# Patient Record
Sex: Female | Born: 1990 | Race: Black or African American | Hispanic: No | Marital: Single | Smoking: Never smoker
Health system: Southern US, Community
[De-identification: ages and names within clinical notes are randomized; demographics above are authoritative.]

---

## 2009-06-14 ENCOUNTER — Emergency Department (HOSPITAL_COMMUNITY): Admission: EM | Admit: 2009-06-14 | Discharge: 2009-06-14 | Payer: Self-pay | Admitting: Emergency Medicine

## 2010-10-19 ENCOUNTER — Emergency Department (HOSPITAL_COMMUNITY)
Admission: EM | Admit: 2010-10-19 | Discharge: 2010-10-19 | Payer: Self-pay | Source: Home / Self Care | Admitting: Emergency Medicine

## 2010-10-21 ENCOUNTER — Emergency Department (HOSPITAL_COMMUNITY)
Admission: EM | Admit: 2010-10-21 | Discharge: 2010-10-21 | Payer: Self-pay | Source: Home / Self Care | Admitting: Emergency Medicine

## 2011-03-04 ENCOUNTER — Inpatient Hospital Stay (INDEPENDENT_AMBULATORY_CARE_PROVIDER_SITE_OTHER)
Admission: RE | Admit: 2011-03-04 | Discharge: 2011-03-04 | Disposition: A | Discharge status: Home / Self Care | Payer: Self-pay | Source: Ambulatory Visit | Attending: Family Medicine | Admitting: Family Medicine

## 2011-03-04 DIAGNOSIS — S335XXA Sprain of ligaments of lumbar spine, initial encounter: Secondary | ICD-10-CM

## 2011-07-06 ENCOUNTER — Emergency Department (HOSPITAL_COMMUNITY)
Admission: EM | Admit: 2011-07-06 | Discharge: 2011-07-06 | Disposition: A | Discharge status: Home / Self Care | Payer: Self-pay | Attending: Emergency Medicine | Admitting: Emergency Medicine

## 2011-07-06 ENCOUNTER — Emergency Department (HOSPITAL_COMMUNITY): Payer: Self-pay

## 2011-07-06 DIAGNOSIS — S51809A Unspecified open wound of unspecified forearm, initial encounter: Secondary | ICD-10-CM | POA: Insufficient documentation

## 2011-07-06 DIAGNOSIS — S61209A Unspecified open wound of unspecified finger without damage to nail, initial encounter: Secondary | ICD-10-CM | POA: Insufficient documentation

## 2011-07-06 DIAGNOSIS — S81009A Unspecified open wound, unspecified knee, initial encounter: Secondary | ICD-10-CM | POA: Insufficient documentation

## 2011-07-19 ENCOUNTER — Emergency Department (HOSPITAL_COMMUNITY)
Admission: EM | Admit: 2011-07-19 | Discharge: 2011-07-19 | Disposition: A | Discharge status: Home / Self Care | Payer: Self-pay | Attending: Emergency Medicine | Admitting: Emergency Medicine

## 2011-07-19 DIAGNOSIS — Z4802 Encounter for removal of sutures: Secondary | ICD-10-CM | POA: Insufficient documentation

## 2011-08-15 ENCOUNTER — Emergency Department (HOSPITAL_COMMUNITY)
Admission: EM | Admit: 2011-08-15 | Discharge: 2011-08-15 | Disposition: A | Discharge status: Home / Self Care | Payer: Self-pay | Source: Home / Self Care

## 2011-08-15 ENCOUNTER — Encounter: Payer: Self-pay | Admitting: *Deleted

## 2011-08-15 DIAGNOSIS — N760 Acute vaginitis: Secondary | ICD-10-CM

## 2011-08-15 DIAGNOSIS — M545 Low back pain: Secondary | ICD-10-CM

## 2011-08-15 LAB — POCT URINALYSIS DIP (DEVICE)
Bilirubin Urine: NEGATIVE
Ketones, ur: NEGATIVE mg/dL
Nitrite: NEGATIVE
Protein, ur: NEGATIVE mg/dL
Specific Gravity, Urine: >=1.03 (ref 1.005–1.030)
pH: 6 (ref 5.0–8.0)
pH: 6 (ref 5.0–8.0)

## 2011-08-15 LAB — WET PREP, GENITAL

## 2011-08-15 MED ORDER — METHOCARBAMOL 750 MG PO TABS: 750.0000 mg | ORAL_TABLET | Freq: Four times a day (QID) | ORAL | Status: AC

## 2011-08-15 MED ORDER — IBUPROFEN 800 MG PO TABS: 800.0000 mg | ORAL_TABLET | Freq: Three times a day (TID) | ORAL | Status: AC

## 2011-08-15 MED ORDER — METRONIDAZOLE 500 MG PO TABS: 500.0000 mg | ORAL_TABLET | Freq: Two times a day (BID) | ORAL | Status: AC
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
## 2011-08-16 LAB — GC/CHLAMYDIA PROBE AMP, GENITAL
Chlamydia, DNA Probe: NEGATIVE
GC Probe Amp, Genital: NEGATIVE

## 2011-08-18 NOTE — ED Provider Notes (Signed)
Medical screening examination/treatment/procedure(s) were performed by non-physician practitioner and as supervising physician I was immediately available for consultation/collaboration.  Luiz Blare MD   Luiz Blare, MD 08/18/11 1007

## 2011-08-18 NOTE — ED Notes (Signed)
Labs and medications reviewed. Pt. adequately treated for bacterial vaginosis  with Flagyl. Vassie Moselle 08/18/2011

## 2012-11-16 ENCOUNTER — Emergency Department (HOSPITAL_COMMUNITY): Payer: Self-pay

## 2012-11-16 ENCOUNTER — Encounter (HOSPITAL_COMMUNITY): Payer: Self-pay | Admitting: Emergency Medicine

## 2012-11-16 ENCOUNTER — Emergency Department (HOSPITAL_COMMUNITY)
Admission: EM | Admit: 2012-11-16 | Discharge: 2012-11-16 | Disposition: A | Discharge status: Home / Self Care | Payer: No Typology Code available for payment source | Attending: Emergency Medicine | Admitting: Emergency Medicine

## 2012-11-16 DIAGNOSIS — S298XXA Other specified injuries of thorax, initial encounter: Secondary | ICD-10-CM | POA: Insufficient documentation

## 2012-11-16 DIAGNOSIS — S46909A Unspecified injury of unspecified muscle, fascia and tendon at shoulder and upper arm level, unspecified arm, initial encounter: Secondary | ICD-10-CM | POA: Insufficient documentation

## 2012-11-16 DIAGNOSIS — IMO0002 Reserved for concepts with insufficient information to code with codable children: Secondary | ICD-10-CM | POA: Insufficient documentation

## 2012-11-16 DIAGNOSIS — Y92410 Unspecified street and highway as the place of occurrence of the external cause: Secondary | ICD-10-CM | POA: Insufficient documentation

## 2012-11-16 DIAGNOSIS — Y9389 Activity, other specified: Secondary | ICD-10-CM | POA: Insufficient documentation

## 2012-11-16 DIAGNOSIS — IMO0001 Reserved for inherently not codable concepts without codable children: Secondary | ICD-10-CM

## 2012-11-16 DIAGNOSIS — T148XXA Other injury of unspecified body region, initial encounter: Secondary | ICD-10-CM | POA: Insufficient documentation

## 2012-11-16 DIAGNOSIS — S4980XA Other specified injuries of shoulder and upper arm, unspecified arm, initial encounter: Secondary | ICD-10-CM | POA: Insufficient documentation

## 2012-11-16 LAB — CBC WITH DIFFERENTIAL/PLATELET
Lymphocytes Relative: 23 % (ref 12–46)
Lymphs Abs: 2.7 10*3/uL (ref 0.7–4.0)
Neutrophils Relative %: 69 % (ref 43–77)
Platelets: 292 10*3/uL (ref 150–400)
RBC: 4.54 MIL/uL (ref 3.87–5.11)
WBC: 11.3 10*3/uL — ABNORMAL HIGH (ref 4.0–10.5)

## 2012-11-16 LAB — COMPREHENSIVE METABOLIC PANEL
ALT: 16 U/L (ref 0–35)
AST: 18 U/L (ref 0–37)
Alkaline Phosphatase: 55 U/L (ref 39–117)
CO2: 23 mEq/L (ref 19–32)
GFR calc Af Amer: >90 mL/min (ref >90)
GFR calc non Af Amer: >90 mL/min (ref >90)
Glucose, Bld: 96 mg/dL (ref 70–99)
Potassium: 4.5 mEq/L (ref 3.5–5.1)
Sodium: 136 mEq/L (ref 135–145)
Total Protein: 7.9 g/dL (ref 6.0–8.3)

## 2012-11-16 MED ORDER — NAPROXEN 250 MG PO TABS
375.0000 mg | ORAL_TABLET | Freq: Once | ORAL | Status: AC
  Administered 2012-11-16: 375 mg via ORAL
  Filled 2012-11-16: qty 2

## 2012-11-16 MED ORDER — HYDROCODONE-ACETAMINOPHEN 5-325 MG PO TABS
1.0000 | ORAL_TABLET | Freq: Once | ORAL | Status: AC
  Administered 2012-11-16: 1 via ORAL
  Filled 2012-11-16: qty 1

## 2012-11-16 MED ORDER — HYDROCODONE-ACETAMINOPHEN 5-325 MG PO TABS: 2.0000 | ORAL_TABLET | ORAL | Status: DC | PRN

## 2012-11-16 MED ORDER — NAPROXEN 375 MG PO TABS: 375.0000 mg | ORAL_TABLET | Freq: Two times a day (BID) | ORAL | Status: DC

## 2012-11-16 MED ORDER — METHOCARBAMOL 500 MG PO TABS
750.0000 mg | ORAL_TABLET | Freq: Once | ORAL | Status: AC
  Administered 2012-11-16: 750 mg via ORAL
  Filled 2012-11-16: qty 2
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
## 2012-11-16 NOTE — ED Notes (Signed)
2317 IV charted in error.
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
## 2013-10-07 ENCOUNTER — Encounter (HOSPITAL_COMMUNITY): Payer: Self-pay | Admitting: Emergency Medicine

## 2013-10-07 ENCOUNTER — Emergency Department (HOSPITAL_COMMUNITY)
Admission: EM | Admit: 2013-10-07 | Discharge: 2013-10-07 | Disposition: A | Discharge status: Home / Self Care | Payer: No Typology Code available for payment source | Attending: Emergency Medicine | Admitting: Emergency Medicine

## 2013-10-07 DIAGNOSIS — M898X1 Other specified disorders of bone, shoulder: Secondary | ICD-10-CM

## 2013-10-07 DIAGNOSIS — Z791 Long term (current) use of non-steroidal anti-inflammatories (NSAID): Secondary | ICD-10-CM | POA: Insufficient documentation

## 2013-10-07 DIAGNOSIS — M25519 Pain in unspecified shoulder: Secondary | ICD-10-CM | POA: Insufficient documentation

## 2013-10-07 MED ORDER — NAPROXEN 500 MG PO TABS: 500.0000 mg | ORAL_TABLET | Freq: Two times a day (BID) | ORAL | Status: AC

## 2013-10-07 MED ORDER — CYCLOBENZAPRINE HCL 10 MG PO TABS: 10.0000 mg | ORAL_TABLET | Freq: Two times a day (BID) | ORAL | Status: AC | PRN

## 2013-10-07 NOTE — Discharge Instructions (Signed)
Start taking naprosyn for pain. Take flexeril for spasms. Try heating pads. Gentle massage. Stretching. Follow up with your doctor for recheck if not improving.   Muscle Cramps and Spasms Muscle cramps and spasms occur when a muscle or muscles tighten and you have no control over this tightening (involuntary muscle contraction). They are a common problem and can develop in any muscle. The most common place is in the calf muscles of the leg. Both muscle cramps and muscle spasms are involuntary muscle contractions, but they also have differences:   Muscle cramps are sporadic and painful. They may last a few seconds to a quarter of an hour. Muscle cramps are often more forceful and last longer than muscle spasms.  Muscle spasms may or may not be painful. They may also last just a few seconds or much longer. CAUSES  It is uncommon for cramps or spasms to be due to a serious underlying problem. In many cases, the cause of cramps or spasms is unknown. Some common causes are:   Overexertion.   Overuse from repetitive motions (doing the same thing over and over).   Remaining in a certain position for a long period of time.   Improper preparation, form, or technique while performing a sport or activity.   Dehydration.   Injury.   Side effects of some medicines.   Abnormally low levels of the salts and ions in your blood (electrolytes), especially potassium and calcium. This could happen if you are taking water pills (diuretics) or you are pregnant.  Some underlying medical problems can make it more likely to develop cramps or spasms. These include, but are not limited to:   Diabetes.   Parkinson disease.   Hormone disorders, such as thyroid problems.   Alcohol abuse.   Diseases specific to muscles, joints, and bones.   Blood vessel disease where not enough blood is getting to the muscles.  HOME CARE INSTRUCTIONS   Stay well hydrated. Drink enough water and fluids to keep  your urine clear or pale yellow.  It may be helpful to massage, stretch, and relax the affected muscle.  For tight or tense muscles, use a warm towel, heating pad, or hot shower water directed to the affected area.  If you are sore or have pain after a cramp or spasm, applying ice to the affected area may relieve discomfort.  Put ice in a plastic bag.  Place a towel between your skin and the bag.  Leave the ice on for 15-20 minutes, 03-04 times a day.  Medicines used to treat a known cause of cramps or spasms may help reduce their frequency or severity. Only take over-the-counter or prescription medicines as directed by your caregiver. SEEK MEDICAL CARE IF:  Your cramps or spasms get more severe, more frequent, or do not improve over time.  MAKE SURE YOU:   Understand these instructions.  Will watch your condition.  Will get help right away if you are not doing well or get worse. Document Released: 03/07/2002 Document Revised: 01/10/2013 Document Reviewed: 09/01/2012 [facility] Patient Information 2014 [place], [place].

## 2013-10-07 NOTE — ED Notes (Signed)
Pt states she had sudden onset L upper back pain, pt points to her shoulder blade.  Pt states it feels like a pulling feeling.  Pt denies any injury.
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
## 2013-10-11 NOTE — ED Provider Notes (Signed)
Medical screening examination/treatment/procedure(s) were performed by non-physician practitioner and as supervising physician I was immediately available for consultation/collaboration.  Pope Brunty J. Florella Mcneese, MD 10/11/13 1128 
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
